# Patient Record
Sex: Female | Born: 1980 | Race: Black or African American | Hispanic: No | Marital: Single | Smoking: Current every day smoker
Health system: Southern US, Community
[De-identification: ages and names within clinical notes are randomized; demographics above are authoritative.]

## PROBLEM LIST (undated history)

## (undated) DIAGNOSIS — A630 Anogenital (venereal) warts: Secondary | ICD-10-CM

## (undated) HISTORY — DX: Anogenital (venereal) warts: A63.0

## (undated) HISTORY — PX: TUBAL LIGATION: SHX77

---

## 2005-08-06 ENCOUNTER — Emergency Department: Payer: Self-pay | Admitting: Emergency Medicine

## 2006-09-11 IMAGING — US US OB US >=[ID] SNGL FETUS
1 series · 17 of 28 positions shown · non-contrast
Comparison: none

REASON FOR EXAM: Pelvic pain 1st trimester. Rm 4 endovaginal as needed
COMMENTS:

[Series 1: us ob us >=(id) sngl fetus · 17 of 56 slices shown]
[im 1/56]
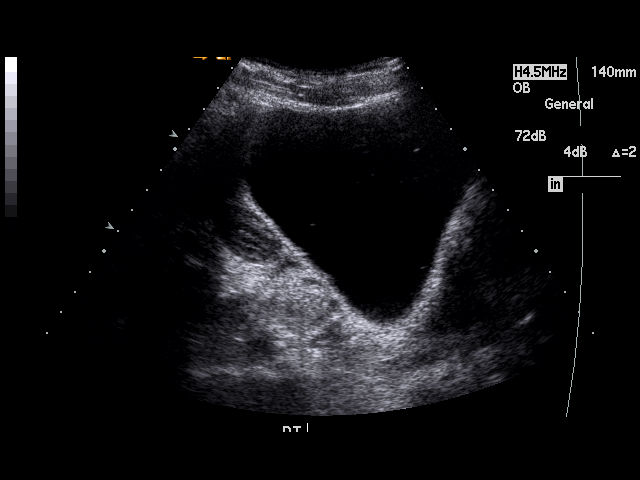
[im 5/56]
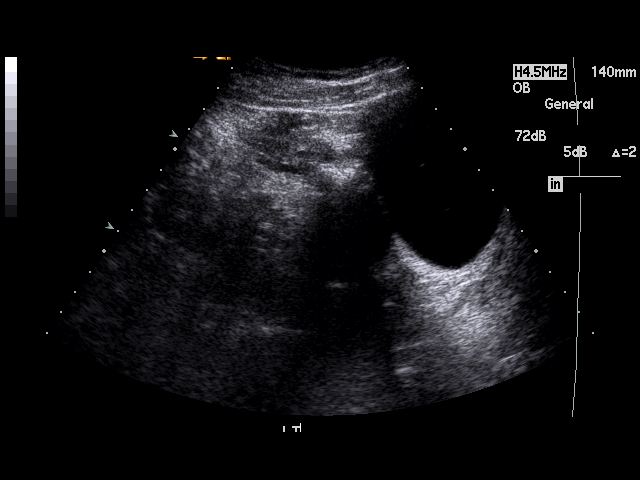
[im 9/56]
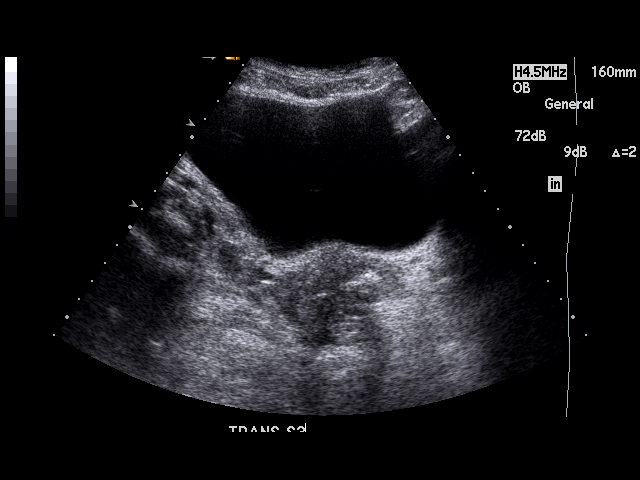
[im 11/56]
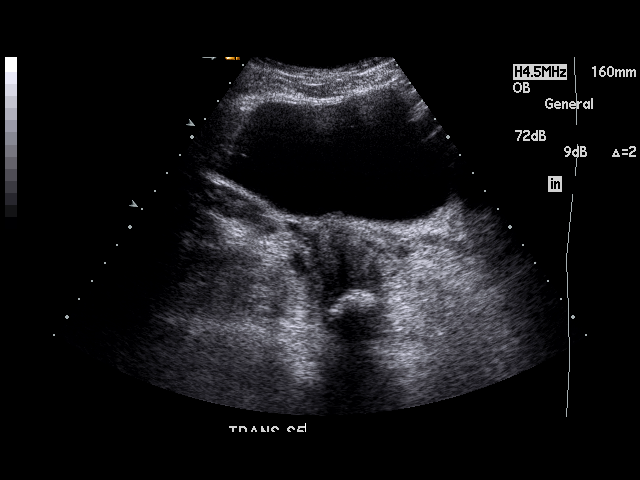
[im 15/56]
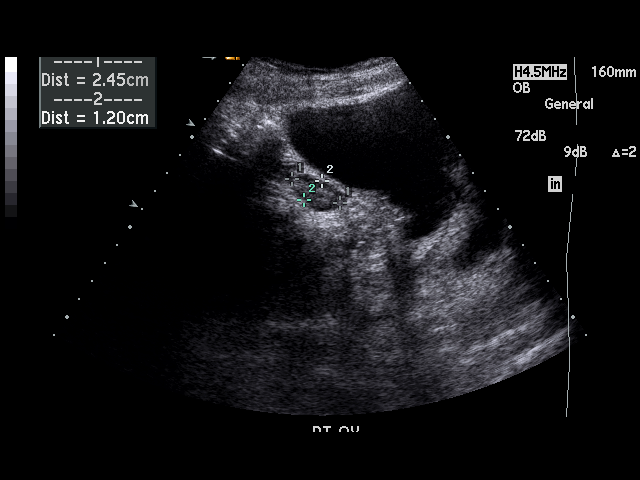
[im 19/56]
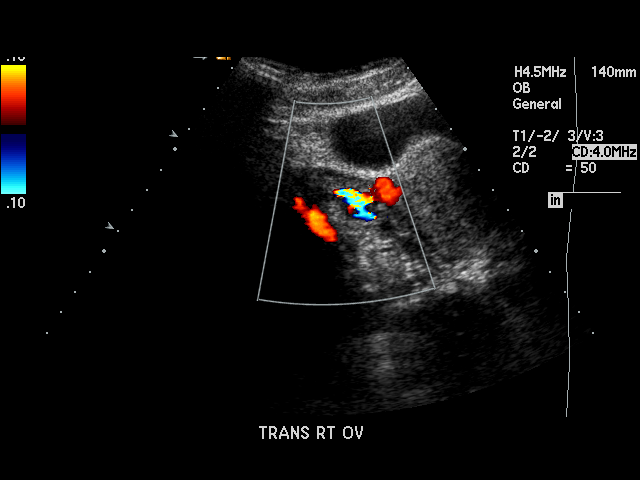
[im 21/56]
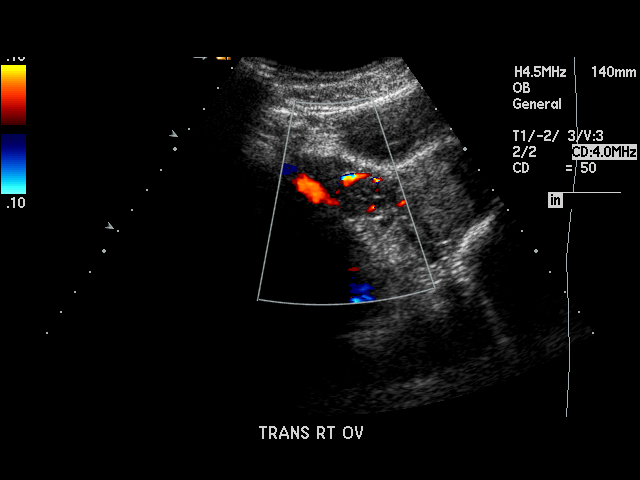
[im 25/56]
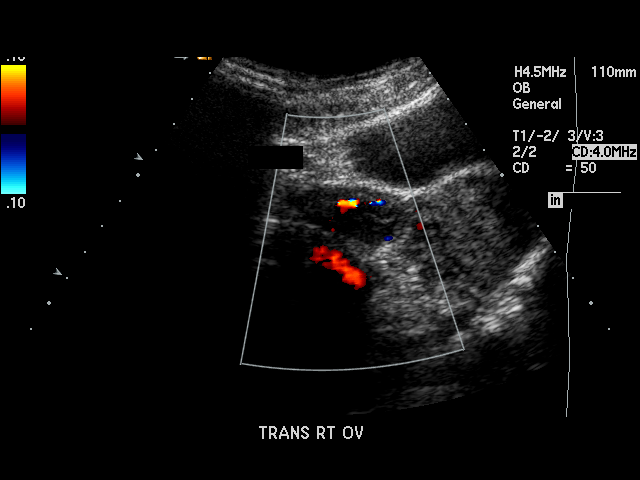
[im 29/56]
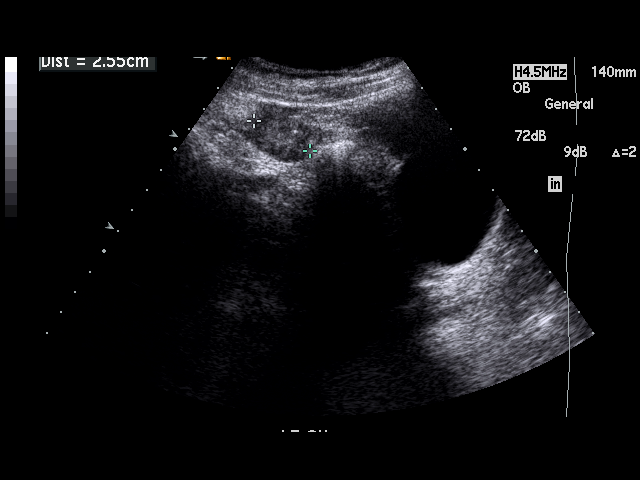
[im 31/56]
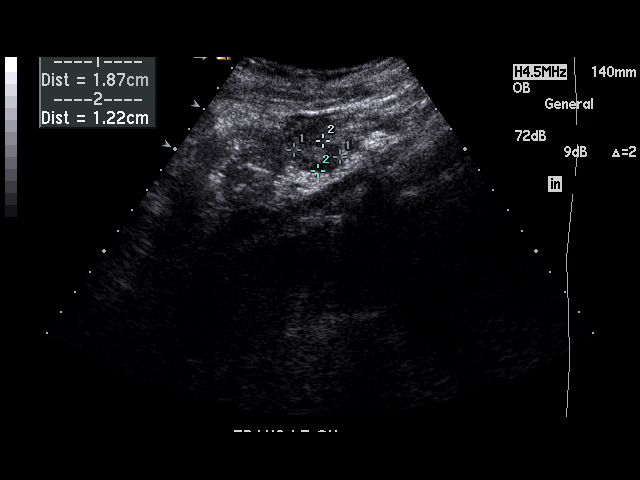
[im 35/56]
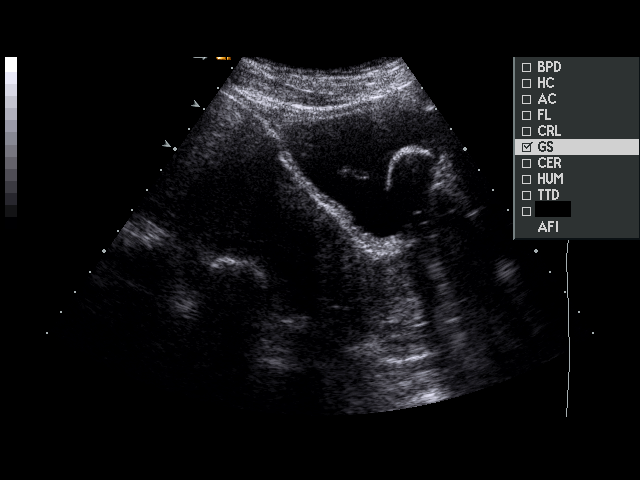
[im 37/56]
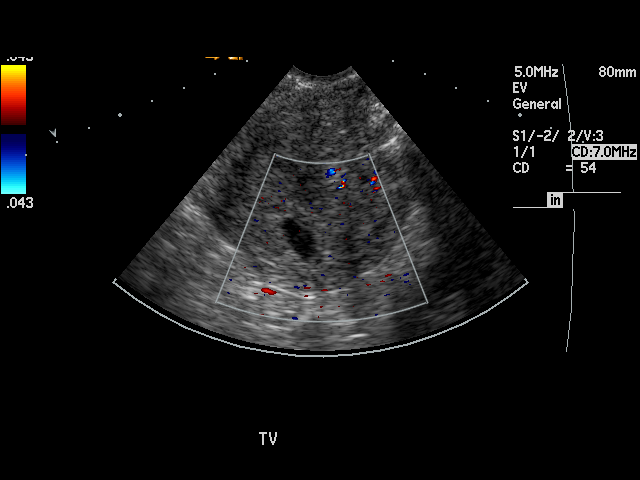
[im 41/56]
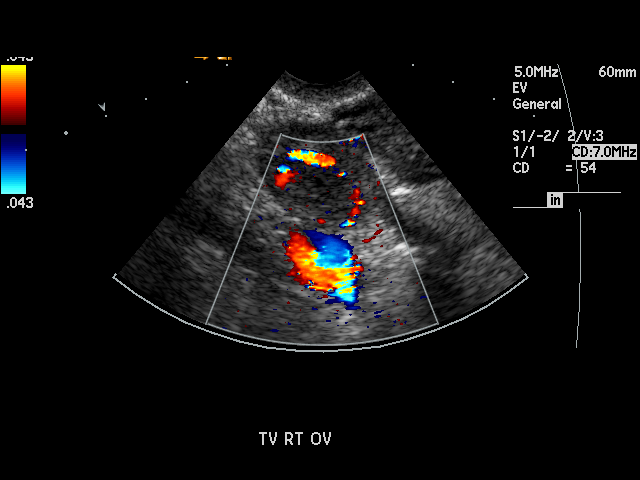
[im 45/56]
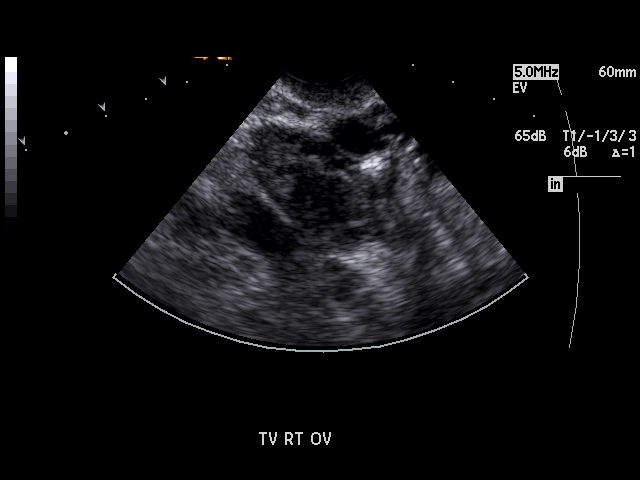
[im 47/56]
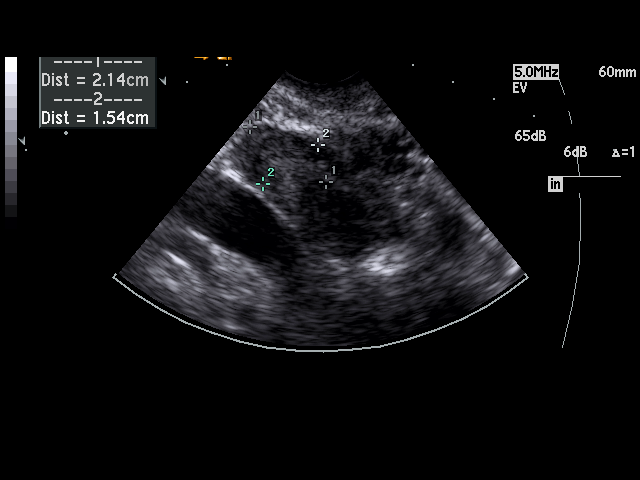
[im 51/56]
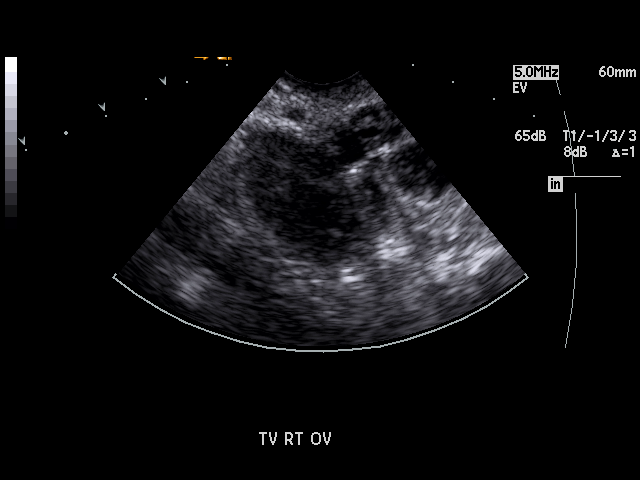
[im 56/56]
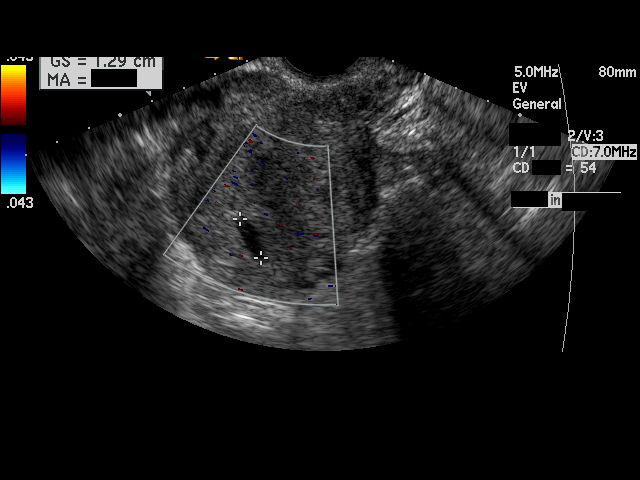

[17 of 28 positions shown; findings below may reference images not displayed]

PROCEDURE:     US  - US PREGNANCY / FETAL AGE  - August 06, 2005  [DATE]

RESULT:     Noted within the uterus is a small sac measuring 1.4 cm.  This
could be a normal early gestational sac or a pseudosac of ectopic pregnancy.
 No fetal pole or yolk sac is identified.  The possibility of an aborting
pregnancy or anembryonic pregnancy cannot be excluded.  Noted in the RIGHT
adnexa and ovary region is a small 1.7 cm hypoechoic region which could
represent a lesion within the RIGHT ovary including ectopic pregnancy and
close serial follow-up beta HCGs and ultrasound is suggested.  The RIGHT
ovary is otherwise unremarkable.  The LEFT ovary is unremarkable.
IMPRESSION: Intrauterine small sac.  Differential diagnosis includes early gestation,
anembryonic pregnancy, aborting pregnancy, and ectopic pregnancy
(pseudosac).

Small hypoechoic 1.7 cm lesion in the RIGHT ovary.  This could represent a
benign lesion such as hemorrhage within a cyst or more ominous process such
as ectopic pregnancy or other focal ovarian pathology. Serial beta HCGs and
pelvic ultrasound is suggested for further evaluation particularly to
exclude ectopic pregnancy.

## 2007-07-09 ENCOUNTER — Emergency Department: Payer: Self-pay | Admitting: Unknown Physician Specialty

## 2017-02-17 ENCOUNTER — Ambulatory Visit: Payer: Self-pay | Admitting: Obstetrics and Gynecology

## 2017-02-20 ENCOUNTER — Encounter: Payer: Self-pay | Admitting: Obstetrics & Gynecology

## 2017-02-20 ENCOUNTER — Ambulatory Visit (INDEPENDENT_AMBULATORY_CARE_PROVIDER_SITE_OTHER): Payer: BLUE CROSS/BLUE SHIELD | Admitting: Obstetrics & Gynecology

## 2017-02-20 VITALS — BP 100/60 | HR 67 | Ht 71.0 in | Wt 209.0 lb

## 2017-02-20 DIAGNOSIS — A630 Anogenital (venereal) warts: Secondary | ICD-10-CM

## 2017-02-20 LAB — NUSWAB VAGINITIS PLUS (VG+): Candida albicans, NAA: POSITIVE

## 2017-02-20 MED ORDER — IMIQUIMOD 5 % EX CREA: TOPICAL_CREAM | CUTANEOUS | 3 refills | Status: DC

## 2017-02-20 NOTE — Patient Instructions (Addendum)
Genital Warts Genital warts are a common STD (sexually transmitted disease). They may appear as small bumps on the tissues of the genital area or anal area. Sometimes, they can become irritated and cause pain. Genital warts are easily passed to other people through sexual contact. Getting treatment is important because genital warts can lead to other problems. In females, the virus that causes genital warts may increase the risk of cervical cancer. What are the causes? Genital warts are caused by a virus that is called human papillomavirus (HPV). HPV is spread by having unprotected sex with an infected person. It can be spread through vaginal, anal, and oral sex. Many people do not know that they are infected. They may be infected for years without problems. However, even if they do not have problems, they can pass the infection to their sexual partners. What increases the risk? Genital warts are more likely to develop in:  People who have unprotected sex.  People who have multiple sexual partners.  People who become sexually active before they are 34 years of age.  Men who are not circumcised.  Women who have a female sexual partner who is not circumcised.  People who have a weakened body defense system (immune system) due to disease or medicine.  People who smoke. What are the signs or symptoms? Symptoms of genital warts include:  Small growths in the genital area or anal area. These warts often grow in clusters.  Itching and irritation in the genital area or anal area.  Bleeding from the warts.  Painful sexual intercourse. How is this diagnosed? Genital warts can usually be diagnosed from their appearance on the vagina, vulva, penis, perineum, anus, or rectum. Tests may also be done, such as:  Biopsy. A tissue sample is removed so it can be looked at under a microscope.  Colposcopy. In females, a magnifying tool is used to examine the vagina and cervix. Certain solutions may be  used to make the HPV cells change color so they can be seen more easily.  A Pap test in females.  Tests for other STDs. How is this treated? Treatment for genital warts may include:  Applying prescription medicines to the warts. These may be solutions or creams.  Freezing the warts with liquid nitrogen (cryotherapy).  Burning the warts with:  Laser treatment.  An electrified probe (electrocautery).  Injecting a substance (Candida antigen or Trichophyton antigen) into the warts to help the body's immune system to fight off the warts.  Interferon injections.  Surgery to remove the warts. Follow these instructions at home: Medicines   Apply over-the-counter and prescription medicines only as told by your health care provider.  Do not treat genital warts with medicines that are used for treating hand warts.  Talk with your health care provider about using over-the-counter anti-itch creams. General instructions   Do not touch or scratch the warts.  Do not have sex until your treatment has been completed.  Tell your current and past sexual partners about your condition because they may also need treatment.  Keep all follow-up visits as told by your health care provider. This is important.  After treatment, use condoms during sex to prevent future infections. Other Instructions for Women   Women who have genital warts might need increased screening for cervical cancer. This type of cancer is slow growing and can be cured if it is found early. Chances of developing cervical cancer are increased with HPV.  If you become pregnant, tell your health care  provider that you have had HPV. Your health care provider will monitor you closely during pregnancy to be sure that your baby is safe. How is this prevented? Talk with your health care provider about getting the HPV vaccines. These vaccines prevent some HPV infections and cancers. It is recommended that the vaccine be given to  males and females who are 569-34 years of age. It will not work if you already have HPV, and it is not recommended for pregnant women. Contact a health care provider if:  You have redness, swelling, or pain in the area of the treated skin.  You have a fever.  You feel generally ill.  You feel lumps in and around your genital area or anal area.  You have bleeding in your genital area or anal area.  You have pain during sexual intercourse. This information is not intended to replace advice given to you by your health care provider. Make sure you discuss any questions you have with your health care provider. Document Released: 11/15/2000 Document Revised: 04/25/2016 Document Reviewed: 02/13/2015 Elsevier Interactive Patient Education  2017 Elsevier Inc.  Imiquimod skin cream What is this medicine? IMIQUIMOD (i mi KWI mod) cream is used to treat external genital or anal warts. It is also used to treat other skin conditions such as actinic keratosis and certain types of skin cancer. This medicine may be used for other purposes; ask your health care provider or pharmacist if you have questions. COMMON BRAND NAME(S): Celesta AverAldara, Zyclara What should I tell my health care provider before I take this medicine? They need to know if you have any of these conditions: -decreased immune function -an unusual or allergic reaction to imiquimod, other medicines, foods, dyes, or preservatives -pregnant or trying to get pregnant -breast-feeding How should I use this medicine? This medicine is for external use only. Do not take by mouth. Follow the directions on the prescription label. Apply just before bedtime. Wash your hands before and after use. Apply a thin layer of cream and massage gently into the affected areas until no longer visible. Do not use in the mouth, eyes or the vagina. Use this medicine only on the affected area as directed by your health care provider. Do not use for longer than prescribed. It  is important not to use more medicine than prescribed. To do so may increase the chance of side effects. Talk to your pediatrician regarding the use of this medicine in children. While this drug may be prescribed for children as young as 34 years of age for selected conditions, precautions do apply. Overdosage: If you think you have taken too much of this medicine contact a poison control center or emergency room at once. NOTE: This medicine is only for you. Do not share this medicine with others. What if I miss a dose? If you miss a dose, use it as soon as you can. If it is almost time for your next dose, use only that dose. Do not use double or extra doses. What may interact with this medicine? Interactions are not expected. Do not use any other medicines on the treated area without asking your doctor or health care professional. This list may not describe all possible interactions. Give your health care provider a list of all the medicines, herbs, non-prescription drugs, or dietary supplements you use. Also tell them if you smoke, drink alcohol, or use illegal drugs. Some items may interact with your medicine. What should I watch for while using this medicine? Visit  your health care professional for regular checks on your progress. Do not use this medicine until the skin has healed from any other drug (example: podofilox or podophyllin resin) or surgical skin treatment. Females should receive regular pelvic exams while being treated for genital warts. Most patients see improvement within 4 weeks. It may take up to 16 weeks to see a full clearing of the warts. This medicine is not a cure. New warts may develop during or after treatment. Avoid sexual (genital, anal, oral) contact while the cream is on the skin. If warts are visible in the genital area, sexual contact should be avoided until the warts are treated. The use of latex condoms during sexual contact may reduce, but not entirely prevent,  infecting others. This medicine may weaken condoms, diaphragms, cervical caps or other barrier devices and make them less effective as birth control. Do not cover the treated area with an airtight bandage. Cotton gauze dressings can be used. Cotton underwear can be worn after using this medicine on the genital or anal area. Actinic keratoses that were not seen before may appear during treatment and may later go away. The treatment area and surrounding area may lighten or darken after treatment with this medicine. These skin color changes may be permanent in some patients. If you experience a skin reaction at the treatment site that interferes or prevents you from doing any daily activity, contact your health care provider. You may need a rest period from treatment. Treatment may be restarted once the reaction has gotten better as recommended by your doctor or health care professional. This medicine can make you more sensitive to the sun. Keep out of the sun. If you cannot avoid being in the sun, wear protective clothing and use sunscreen. Do not use sun lamps or tanning beds/booths. What side effects may I notice from receiving this medicine? Side effects that you should report to your doctor or health care professional as soon as possible: -open sores with or without drainage -skin infection -skin rash -unusual or severe skin reaction Side effects that usually do not require medical attention (report to your doctor or health care professional if they continue or are bothersome): -burning or itching -redness of the skin (very common but is usually not painful or harmful) -scabbing, crusting, or peeling skin -skin that becomes hard or thickened -swelling of the skin This list may not describe all possible side effects. Call your doctor for medical advice about side effects. You may report side effects to FDA at 1-800-FDA-1088. Where should I keep my medicine? Keep out of the reach of  children. Store between 4 and 25 degrees C (39 and 77 degrees F). Do not freeze. Throw away any unused medicine after the expiration date. Discard packet after applying to affected area. Partial packets should not be saved or reused. NOTE: This sheet is a summary. It may not cover all possible information. If you have questions about this medicine, talk to your doctor, pharmacist, or health care provider.  2018 Elsevier/Gold Standard (2008-11-01 10:33:25)

## 2017-02-20 NOTE — Progress Notes (Signed)
  CC: Labial Lesion Patient presents for female check due to recent lesion. Sexual history reviewed with the patient. STD exposure: denies knowledge of risky exposure.  Previous history of STD:  Warts in distant pass, treated by "burning them off".. Current symptoms include recent bump on perineum. Symptoms include itch; no pain or leakage or discharge.  Contraception: tubal ligation.   PMHx: She  has a past medical history of Warts, genital. Also,  has a past surgical history that includes Cesarean section and Tubal ligation., family history includes Hypertension in her mother.,  reports that she has been smoking.  She has never used smokeless tobacco. She reports that she drinks alcohol. She reports that she does not use drugs.  She has a current medication list which includes the following prescription(s): imiquimod. Also, has no allergies on file.  Review of Systems  Constitutional: Negative for chills, fever and malaise/fatigue.  HENT: Negative for congestion, sinus pain and sore throat.   Eyes: Negative for blurred vision and pain.  Respiratory: Negative for cough and wheezing.   Cardiovascular: Negative for chest pain and leg swelling.  Gastrointestinal: Negative for abdominal pain, constipation, diarrhea, heartburn, nausea and vomiting.  Genitourinary: Negative for dysuria, frequency, hematuria and urgency.  Musculoskeletal: Negative for back pain, joint pain, myalgias and neck pain.  Skin: Negative for itching and rash.  Neurological: Negative for dizziness, tremors and weakness.  Endo/Heme/Allergies: Does not bruise/bleed easily.  Psychiatric/Behavioral: Negative for depression. The patient is not nervous/anxious and does not have insomnia.     Objective: BP 100/60   Pulse 67   Ht 5\' 11"  (1.803 m)   Wt 209 lb (94.8 kg)   LMP 02/17/2017   BMI 29.15 kg/m  Physical Exam  Constitutional: She is oriented to person, place, and time. She appears well-developed and well-nourished.  No distress.  Genitourinary: Vagina normal. Pelvic exam was performed with patient supine.  There is lesion on the right labia. There is no rash or tenderness on the right labia. There is no rash, tenderness or lesion on the left labia.    No erythema or bleeding in the vagina.  Genitourinary Comments: Right sided 0.75cm lesion flaky raised white  Abdominal: Soft. She exhibits no distension. There is no tenderness.  Musculoskeletal: Normal range of motion.  Neurological: She is alert and oriented to person, place, and time. No cranial nerve deficit.  Skin: Skin is warm and dry.  Psychiatric: She has a normal mood and affect.    ASSESSMENT/PLAN:    Visit Diagnoses    Condyloma acuminata    -  Primary   Relevant Medications   imiquimod (ALDARA) 5 % cream    Monitor Sexual partner/ contagious discussed Annual when due  Annamarie MajorPaul Shakeera Rightmyer, MD, Merlinda FrederickFACOG [facility], [facility] 02/20/2017  4:02 PM

## 2017-03-31 ENCOUNTER — Ambulatory Visit: Payer: BLUE CROSS/BLUE SHIELD | Admitting: Obstetrics & Gynecology

## 2018-01-23 DIAGNOSIS — Z0000 Encounter for general adult medical examination without abnormal findings: Secondary | ICD-10-CM | POA: Diagnosis not present

## 2018-01-23 DIAGNOSIS — D500 Iron deficiency anemia secondary to blood loss (chronic): Secondary | ICD-10-CM | POA: Diagnosis not present

## 2018-01-23 DIAGNOSIS — F17200 Nicotine dependence, unspecified, uncomplicated: Secondary | ICD-10-CM | POA: Diagnosis not present

## 2019-06-21 DIAGNOSIS — M222X1 Patellofemoral disorders, right knee: Secondary | ICD-10-CM | POA: Diagnosis not present

## 2019-06-21 DIAGNOSIS — M222X2 Patellofemoral disorders, left knee: Secondary | ICD-10-CM | POA: Diagnosis not present

## 2023-12-17 ENCOUNTER — Ambulatory Visit: Payer: Self-pay | Admitting: Family Medicine

## 2024-07-25 ENCOUNTER — Encounter: Payer: Self-pay | Admitting: Emergency Medicine

## 2024-07-25 ENCOUNTER — Ambulatory Visit
Admission: EM | Admit: 2024-07-25 | Discharge: 2024-07-25 | Disposition: A | Discharge status: Home / Self Care | Attending: Emergency Medicine | Admitting: Emergency Medicine

## 2024-07-25 DIAGNOSIS — H66003 Acute suppurative otitis media without spontaneous rupture of ear drum, bilateral: Secondary | ICD-10-CM

## 2024-07-25 MED ORDER — AMOXICILLIN-POT CLAVULANATE 875-125 MG PO TABS: 1.0000 | ORAL_TABLET | Freq: Two times a day (BID) | ORAL | 0 refills | Status: AC

## 2024-07-25 NOTE — ED Provider Notes (Signed)
 [facility]    CSN: [id number] Arrival date & time: 07/25/24  1238      History   Chief Complaint Chief Complaint  Patient presents with   Otalgia    right   Headache    HPI Diane Fuller is a 43 y.o. female.   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  This is causing her to have pain behind her right eye and a right sided headache.  She denies any fever, drainage from the ear, or changes in hearing.  No URI symptoms.  She has experienced some slight ringing in the ear.  Past Medical History:  Diagnosis Date   Warts, genital     There are no active problems to display for this patient.   Past Surgical History:  Procedure Laterality Date   CESAREAN SECTION     TUBAL LIGATION      OB History     Gravida  6   Para  4   Term  4   Preterm      AB  2   Living  4      SAB      IAB      Ectopic      Multiple      Live Births  4            Home Medications    Prior to Admission medications   Medication Sig Start Date End Date Taking? Authorizing Provider  amoxicillin -clavulanate (AUGMENTIN ) 875-125 MG tablet Take 1 tablet by mouth every 12 (twelve) hours for 7 days. 07/25/24 08/01/24 Yes Bernardino Ditch, NP    Family History Family History  Problem Relation Age of Onset   Hypertension Mother     Social History Social History   Tobacco Use   Smoking status: Former    Types: Cigarettes   Smokeless tobacco: Never  Vaping Use   Vaping status: Every Day  Substance Use Topics   Alcohol use: Yes   Drug use: No     Allergies   Patient has no known allergies.   Review of Systems Review of Systems  Constitutional:  Negative for fever.  HENT:  Positive for ear pain and tinnitus. Negative for congestion, ear discharge, hearing loss and rhinorrhea.   Neurological:  Positive for headaches.     Physical Exam Triage Vital  Signs ED Triage Vitals  Encounter Vitals Group     BP      Girls Systolic BP Percentile      Girls Diastolic BP Percentile      Boys Systolic BP Percentile      Boys Diastolic BP Percentile      Pulse      Resp      Temp      Temp src      SpO2      Weight      Height      Head Circumference      Peak Flow      Pain Score      Pain Loc      Pain Education      Exclude from Growth Chart    No data found.  Updated Vital Signs BP 107/73 (BP Location: Right Arm)   Pulse 65   Temp 98.3 F (36.8 C) (Oral)   Resp 14   Ht 5' 11 (1.803 m)   Wt 208 lb  15.9 oz (94.8 kg)   LMP 06/29/2024 (Approximate)   SpO2 95%   BMI 29.15 kg/m   Visual Acuity Right Eye Distance:   Left Eye Distance:   Bilateral Distance:    Right Eye Near:   Left Eye Near:    Bilateral Near:     Physical Exam Vitals and nursing note reviewed.  Constitutional:      Appearance: Normal appearance. She is not ill-appearing.  HENT:     Head: Normocephalic and atraumatic.     Right Ear: Ear canal and external ear normal. There is no impacted cerumen.     Left Ear: Ear canal and external ear normal. There is no impacted cerumen.     Ears:     Comments: Bilateral tympanic membranes are erythematous.  The right ear is tender with movement of the auricle of the ear. Skin:    General: Skin is warm and dry.     Capillary Refill: Capillary refill takes less than 2 seconds.     Findings: No rash.  Neurological:     General: No focal deficit present.     Mental Status: She is alert and oriented to person, place, and time.      UC Treatments / Results  Labs (all labs ordered are listed, but only abnormal results are displayed) Labs Reviewed - No data to display  EKG   Radiology No results found.  Procedures Procedures (including critical care time)  Medications Ordered in UC Medications - No data to display  Initial Impression / Assessment and Plan / UC Course  I have reviewed the triage  vital signs and the nursing notes.  Pertinent labs & imaging results that were available during my care of the patient were reviewed by me and considered in my medical decision making (see chart for details).   Patient is a pleasant, nontoxic-appearing 43 year old female presenting for evaluation of right ear pain as outlined HPI above.  Her physical exam reveals bilateral tympanic membrane erythema.  Both EACs are clear.  The patient does have pain with movement of the auricle of her right ear.  No evidence of otitis externa present.  Also no evidence of mastoiditis.  I will treat the patient for bilateral otitis media on Augmentin  875 twice daily for 7 days.  Tylenol and ibuprofen as needed for pain.  Return precautions reviewed.  Work note provided.   Final Clinical Impressions(s) / UC Diagnoses   Final diagnoses:  Non-recurrent acute suppurative otitis media of both ears without spontaneous rupture of tympanic membranes     Discharge Instructions      Take the Augmentin  twice daily for 7 days with food for treatment of your ear infection.  Take an over-the-counter probiotic 1 hour after each dose of antibiotic to prevent diarrhea.  Use over-the-counter Tylenol and ibuprofen as needed for pain or fever.  Place a hot water bottle, or heating pad, underneath your pillowcase at night to help dilate up your ear and aid in pain relief as well as resolution of the infection.  Return for reevaluation for any new or worsening symptoms.      ED Prescriptions     Medication Sig Dispense Auth. Provider   amoxicillin -clavulanate (AUGMENTIN ) 875-125 MG tablet Take 1 tablet by mouth every 12 (twelve) hours for 7 days. 14 tablet Bernardino Ditch, NP      PDMP not reviewed this encounter.   Bernardino Ditch, NP 07/25/24 1256

## 2024-07-25 NOTE — ED Triage Notes (Signed)
 Patient c/o right ear pain that started a week ago.  Patient reports that she also is having right sided headache and pain behind her right eye.  Patient denies N/V.  Patient denies fevers.  Patient has not tried any OTC medicine.

## 2024-07-25 NOTE — Discharge Instructions (Signed)
 Take the Augmentin twice daily for 7 days with food for treatment of your ear infection.  Take an over-the-counter probiotic 1 hour after each dose of antibiotic to prevent diarrhea.  Use over-the-counter Tylenol and ibuprofen as needed for pain or fever.  Place a hot water bottle, or heating pad, underneath your pillowcase at night to help dilate up your ear and aid in pain relief as well as resolution of the infection.  Return for reevaluation for any new or worsening symptoms.

## 2024-08-26 NOTE — Progress Notes (Deleted)
    New patient visit   Patient: Diane Fuller   DOB: 09-24-1981   43 y.o. Female  MRN: [id number] Visit Date: 08/27/2024  Today's healthcare provider: Isaiah DELENA Pepper, MD   No chief complaint on file.  Subjective    Diane Fuller is a 43 y.o. female who presents today as a new patient to establish care.   HPI:  ***    Past Medical History:  Diagnosis Date   Warts, genital    Past Surgical History:  Procedure Laterality Date   CESAREAN SECTION     TUBAL LIGATION     Family Status  Relation Name Status   Mother  (Not Specified)  No partnership data on file   Family History  Problem Relation Age of Onset   Hypertension Mother    Social History   Socioeconomic History   Marital status: Single    Spouse name: Not on file   Number of children: Not on file   Years of education: Not on file   Highest education level: Not on file  Occupational History   Not on file  Tobacco Use   Smoking status: Former    Types: Cigarettes   Smokeless tobacco: Never  Vaping Use   Vaping status: Every Day  Substance and Sexual Activity   Alcohol use: Yes   Drug use: No   Sexual activity: Yes    Birth control/protection: Surgical  Other Topics Concern   Not on file  Social History Narrative   Not on file   Social Drivers of Health   Financial Resource Strain: Not on file  Food Insecurity: Not on file  Transportation Needs: Not on file  Physical Activity: Not on file  Stress: Not on file  Social Connections: Not on file   No outpatient medications prior to visit.   No facility-administered medications prior to visit.   No Known Allergies  Reviews of Systems as noted in HPI.  {Insert previous labs (optional):23779} {See past labs  Heme  Chem  Endocrine  Serology  Results Review (optional):1}   Objective    There were no vitals taken for this visit. {Insert last BP/Wt (optional):23777}{See vitals history (optional):1}   Physical  Exam Constitutional:      Appearance: Normal appearance.  HENT:     Head: Normocephalic and atraumatic.     Mouth/Throat:     Mouth: Mucous membranes are moist.  Eyes:     Pupils: Pupils are equal, round, and reactive to light.  Pulmonary:     Effort: Pulmonary effort is normal.  Skin:    General: Skin is warm.  Neurological:     General: No focal deficit present.     Mental Status: She is alert.     Depression Screen     No data to display         No results found for any visits on 08/27/24.  Assessment & Plan      Problem List Items Addressed This Visit   None    No follow-ups on file.      Isaiah DELENA Pepper, MD  [facility] [phone] (phone) [phone] (fax)

## 2024-08-27 ENCOUNTER — Ambulatory Visit

## 2024-09-16 ENCOUNTER — Encounter: Admitting: Certified Nurse Midwife

## 2024-09-24 ENCOUNTER — Ambulatory Visit (INDEPENDENT_AMBULATORY_CARE_PROVIDER_SITE_OTHER): Admitting: Physician Assistant

## 2024-09-24 ENCOUNTER — Encounter: Payer: Self-pay | Admitting: Physician Assistant

## 2024-09-24 VITALS — BP 102/73 | HR 71 | Resp 14 | Ht 69.0 in | Wt 221.6 lb

## 2024-09-24 DIAGNOSIS — Z720 Tobacco use: Secondary | ICD-10-CM

## 2024-09-24 DIAGNOSIS — E669 Obesity, unspecified: Secondary | ICD-10-CM

## 2024-09-24 DIAGNOSIS — Z1159 Encounter for screening for other viral diseases: Secondary | ICD-10-CM

## 2024-09-24 DIAGNOSIS — L819 Disorder of pigmentation, unspecified: Secondary | ICD-10-CM

## 2024-09-24 DIAGNOSIS — Z7689 Persons encountering health services in other specified circumstances: Secondary | ICD-10-CM

## 2024-09-24 DIAGNOSIS — F1090 Alcohol use, unspecified, uncomplicated: Secondary | ICD-10-CM

## 2024-09-24 NOTE — Progress Notes (Signed)
 New patient visit  Patient: Diane Fuller   DOB: 01-08-1981   43 y.o. Female  MRN: [id number] Visit Date: 09/24/2024  Today's healthcare provider: Jolynn Spencer, PA-C   Chief Complaint  Patient presents with   New Patient (Initial Visit)    Last PCP: has not had one  Last mammogram: none done Last PAP: maybe 3-4 years ago   Subjective    Diane Fuller is a 43 y.o. female who presents today as a new patient to establish care.   HPI     New Patient (Initial Visit)    Additional comments: Last PCP: has not had one  Last mammogram: none done Last PAP: maybe 3-4 years ago      Last edited by Wilfred Hargis RAMAN, CMA on 09/24/2024  3:08 PM.      Discussed the use of AI scribe software for clinical note transcription with the patient, who gave verbal consent to proceed.  History of Present Illness Diane Fuller is a 43 year old female who presents to establish primary care.  She seeks guidance on scheduling a mammogram and is uncertain whether to arrange it through her primary care provider or OBGYN. Her Pap smears have been normal. She has no history of chest pain, shortness of breath, palpitations, or chronic conditions. It has been a significant amount of time since her last blood work.  She consumes alcohol occasionally, typically drinking twice a week, which includes three shots, two beers, or a couple of glasses of wine. She smoked for fifteen years, with a pack lasting two days, but has switched to nicotine vaping for the past year. She does not use cannabis or recreational drugs.  She experiences anxiety but has never been formally diagnosed. She is currently seeing a therapist at [facility], which is not covered by her insurance. She is concerned about the copay if she were to switch to a therapist covered by her insurance.  She works as a engineer, civil (consulting) at [employer], a nursing home, and describes the work as 'very, very tough.'    Past Medical History:   Diagnosis Date   Warts, genital    Past Surgical History:  Procedure Laterality Date   CESAREAN SECTION     TUBAL LIGATION     Family Status  Relation Name Status   Mother  (Not Specified)  No partnership data on file   Family History  Problem Relation Age of Onset   Hypertension Mother    Social History   Socioeconomic History   Marital status: Single    Spouse name: Not on file   Number of children: Not on file   Years of education: Not on file   Highest education level: Not on file  Occupational History   Not on file  Tobacco Use   Smoking status: Former    Types: Cigarettes   Smokeless tobacco: Never  Vaping Use   Vaping status: Every Day   Substances: Nicotine  Substance and Sexual Activity   Alcohol use: Yes   Drug use: No   Sexual activity: Yes    Birth control/protection: Surgical  Other Topics Concern   Not on file  Social History Narrative   Not on file   Social Drivers of Health   Financial Resource Strain: Low Risk  (09/24/2024)   Overall Financial Resource Strain (CARDIA)    Difficulty of Paying Living Expenses: Not hard at all  Food Insecurity: No Food Insecurity (09/24/2024)   Hunger Vital Sign    Worried  About Running Out of Food in the Last Year: Never true    Ran Out of Food in the Last Year: Never true  Transportation Needs: No Transportation Needs (09/24/2024)   PRAPARE - Administrator, Civil Service (Medical): No    Lack of Transportation (Non-Medical): No  Physical Activity: Sufficiently Active (09/24/2024)   Exercise Vital Sign    Days of Exercise per Week: 5 days    Minutes of Exercise per Session: 60 min  Stress: No Stress Concern Present (09/24/2024)   [employer] of Occupational Health - Occupational Stress Questionnaire    Feeling of Stress: Not at all  Social Connections: Not on file   No outpatient medications prior to visit.   No facility-administered medications prior to visit.   No Known  Allergies  Immunization History  Administered Date(s) Administered   Moderna Sars-Covid-2 Vaccination 01/10/2020, 02/07/2020   PPD Test 05/09/2020   Td (Adult),5 Lf Tetanus Toxid, Preservative Free 05/09/2020   Tdap 10/26/2008    Health Maintenance  Topic Date Due   HIV Screening  Never done   Hepatitis C Screening  Never done   Hepatitis B Vaccines 19-59 Average Risk (1 of 3 - 19+ 3-dose series) Never done   HPV VACCINES (1 - 3-dose SCDM series) Never done   Cervical Cancer Screening (HPV/Pap Cotest)  10/14/2013   Mammogram  Never done   COVID-19 Vaccine (3 - 2025-26 season) 08/02/2024   Influenza Vaccine  03/01/2025 (Originally 07/02/2024)   DTaP/Tdap/Td (3 - Td or Tdap) 05/09/2030   Pneumococcal Vaccine  Aged Out   Meningococcal B Vaccine  Aged Out    Patient Care Team: Pearl Berlinger, PA-C as PCP - General (Physician Assistant)  Review of Systems Except see HPI       Objective    BP 102/73   Pulse 71   Resp 14   Ht 5' 9 (1.753 m)   Wt 221 lb 9.6 oz (100.5 kg)   LMP 09/19/2024   SpO2 99%   BMI 32.72 kg/m     Physical Exam Vitals reviewed.  Constitutional:      General: She is not in acute distress.    Appearance: Normal appearance. She is well-developed. She is obese. She is not diaphoretic.  HENT:     Head: Normocephalic and atraumatic.  Eyes:     General: No scleral icterus.    Conjunctiva/sclera: Conjunctivae normal.  Neck:     Thyroid: No thyromegaly.  Cardiovascular:     Rate and Rhythm: Normal rate and regular rhythm.     Pulses: Normal pulses.     Heart sounds: Normal heart sounds. No murmur heard. Pulmonary:     Effort: Pulmonary effort is normal. No respiratory distress.     Breath sounds: Normal breath sounds. No wheezing, rhonchi or rales.  Musculoskeletal:     Cervical back: Neck supple.     Right lower leg: No edema.     Left lower leg: No edema.  Lymphadenopathy:     Cervical: No cervical adenopathy.  Skin:    General: Skin is  warm and dry.     Findings: No rash.  Neurological:     Mental Status: She is alert and oriented to person, place, and time. Mental status is at baseline.  Psychiatric:        Mood and Affect: Mood normal.        Behavior: Behavior normal.     Depression Screen    09/24/2024  3:12 PM  PHQ 2/9 Scores  PHQ - 2 Score 0   No results found for any visits on 09/24/24.  Assessment & Plan      Assessment & Plan Adult office Visit Routine visit to establish primary care. No chronic conditions. Due for mammogram. - Order comprehensive blood work including Hepatitis C antibody screening. - Discuss mammogram scheduling with OBGYN.  Anxiety Chronic Reports anxiety, seeing therapist at [facility], not covered by insurance. - Discuss potential transition to in-clinic counseling services that may be covered by insurance. - Suggest contacting insurance to check coverage for in-clinic counseling services.  Tobacco Use Chronic Smoking for fifteen years, using nicotine vaping for one year. No cannabis use. Cessation advised Will revisit  Alcohol Use Alcohol consumption twice a week, three shots, two beers, or a couple of glasses of wine per occasion. - Advise limiting intake to less than four shots per occasion to avoid increased risk, especially in women.  Obesity (BMI 30-39.9) (Primary) Chronic and stable Body mass index is 32.72 kg/m. Weight loss of 5% of pt's current weight via healthy diet and daily exercise encouraged. Workup ordered - Hemoglobin A1c - Comprehensive metabolic panel with GFR - Lipid Panel With LDL/HDL Ratio - TSH Will follow-up  Need for hepatitis C screening test Low risk screening - Hepatitis C antibody  Pigmented skin lesion  - Ambulatory referral to Dermatology  Encounter to establish care Welcomed to our clinic Reviewed past medical hx, social hx, family hx and surgical hx Pt advised to send all vaccination records or screening   No  follow-ups on file.    The patient was advised to call back or seek an in-person evaluation if the symptoms worsen or if the condition fails to improve as anticipated.  I discussed the assessment and treatment plan with the patient. The patient was provided an opportunity to ask questions and all were answered. The patient agreed with the plan and demonstrated an understanding of the instructions.  I, Kamrynn Melott, PA-C have reviewed all documentation for this visit. The documentation on  09/24/2024   for the exam, diagnosis, procedures, and orders are all accurate and complete.  Jolynn Spencer, [facility], MMS [facility] [phone] (phone) [phone] (fax)  [facility]

## 2024-09-25 LAB — LIPID PANEL WITH LDL/HDL RATIO
Cholesterol, Total: 254 mg/dL — ABNORMAL HIGH (ref 100–199)
HDL: 85 mg/dL (ref >39)
LDL Chol Calc (NIH): 159 mg/dL — ABNORMAL HIGH (ref 0–99)
LDL/HDL Ratio: 1.9 ratio (ref 0.0–3.2)
Triglycerides: 64 mg/dL (ref 0–149)
VLDL Cholesterol Cal: 10 mg/dL (ref 5–40)

## 2024-09-25 LAB — COMPREHENSIVE METABOLIC PANEL WITH GFR
ALT: 14 IU/L (ref 0–32)
AST: 19 IU/L (ref 0–40)
Albumin: 4.3 g/dL (ref 3.9–4.9)
Alkaline Phosphatase: 55 IU/L (ref 41–116)
BUN/Creatinine Ratio: 19 (ref 9–23)
BUN: 14 mg/dL (ref 6–24)
Bilirubin Total: 0.5 mg/dL (ref 0.0–1.2)
CO2: 22 mmol/L (ref 20–29)
Calcium: 9.1 mg/dL (ref 8.7–10.2)
Chloride: 101 mmol/L (ref 96–106)
Creatinine, Ser: 0.73 mg/dL (ref 0.57–1.00)
Globulin, Total: 3.2 g/dL (ref 1.5–4.5)
Glucose: 87 mg/dL (ref 70–99)
Potassium: 4.1 mmol/L (ref 3.5–5.2)
Sodium: 135 mmol/L (ref 134–144)
Total Protein: 7.5 g/dL (ref 6.0–8.5)
eGFR: 107 mL/min/1.73 (ref >59)

## 2024-09-25 LAB — HEMOGLOBIN A1C
Est. average glucose Bld gHb Est-mCnc: 114 mg/dL
Hgb A1c MFr Bld: 5.6 % (ref 4.8–5.6)

## 2024-09-25 LAB — HEPATITIS C ANTIBODY: Hep C Virus Ab: NONREACTIVE

## 2024-09-25 LAB — TSH: TSH: 1.92 u[IU]/mL (ref 0.450–4.500)

## 2024-09-27 ENCOUNTER — Ambulatory Visit: Payer: Self-pay | Admitting: Physician Assistant

## 2024-09-27 DIAGNOSIS — E669 Obesity, unspecified: Secondary | ICD-10-CM | POA: Insufficient documentation

## 2024-09-27 DIAGNOSIS — Z720 Tobacco use: Secondary | ICD-10-CM | POA: Insufficient documentation

## 2024-09-27 DIAGNOSIS — F1090 Alcohol use, unspecified, uncomplicated: Secondary | ICD-10-CM | POA: Insufficient documentation

## 2024-09-27 DIAGNOSIS — L819 Disorder of pigmentation, unspecified: Secondary | ICD-10-CM | POA: Insufficient documentation

## 2024-10-20 ENCOUNTER — Ambulatory Visit

## 2024-10-20 DIAGNOSIS — D229 Melanocytic nevi, unspecified: Secondary | ICD-10-CM

## 2024-10-20 DIAGNOSIS — D225 Melanocytic nevi of trunk: Secondary | ICD-10-CM

## 2024-10-20 NOTE — Patient Instructions (Addendum)

## 2024-10-20 NOTE — Progress Notes (Signed)
    Subjective   Diane Fuller is a 43 y.o. female who presents for the following: Lesion(s) of concern . Patient is new patient  Today patient reports: Area of concern on the right flank and L mons   Review of Systems:    No other skin or systemic complaints except as noted in HPI or Assessment and Plan.  The following portions of the chart were reviewed this encounter and updated as appropriate: medications, allergies, medical history  Relevant Medical History:  n/a   Objective  (SKPE) Well appearing patient in no apparent distress; mood and affect are within normal limits. Examination was performed of the: Focused Exam of: right flank  and Genital exam female: groin, mons pubis, labia majora, labia minora, vulva, buttocks. Chaperone present in room.   Examination notable for: Nevus/nevi: Scattered well-demarcated, regular, pigmented macule(s) and/or papule(s)   on R flank, L mons pubis  Examination limited by: Clothing and Patient deferred removal       Assessment & Plan  (SKAP)   Melanocytic nevi x 2 - R flank, L mons pubis  - Benign appearing on today's exam, patient reassured - Continue active observation - Pt instructed on A,B,C, D, and Es of an evolving melanoma - Pt instructed to contact clinic if any of these worrisome changes arise - Reinforced importance of photoprotective strategies including liberal and frequent sunscreen use of a broad-spectrum SPF 30 or greater, use of protective clothing, and sun avoidance for prevention of cutaneous malignancy and photoaging.  Counseled patient on the importance of regular self-skin monitoring as well as routine clinical skin examinations as scheduled.     Level of service outlined above   Patient instructions (SKPI)   Procedures, orders, diagnosis for this visit:    There are no diagnoses linked to this encounter.  Return to clinic: Return if symptoms worsen or fail to improve.  I, Emerick Ege, CMA am acting as  scribe for Lauraine JAYSON Kanaris, MD.   Documentation: I have reviewed the above documentation for accuracy and completeness, and I agree with the above.  Lauraine JAYSON Kanaris, MD

## 2024-10-29 NOTE — Progress Notes (Deleted)
 Complete physical exam  Patient: Diane Fuller   DOB: 12/11/1980   43 y.o. Female  MRN: [id number] Visit Date: 11/05/2024  Today's healthcare provider: Jolynn Spencer, PA-C   No chief complaint on file.  Subjective    Diane Fuller is a 43 y.o. female who presents today for a complete physical exam.  She reports consuming a {diet types:17450} diet. {Exercise:19826} She generally feels {well/fairly well/poorly:18703}. She reports sleeping {well/fairly well/poorly:18703}. She {does/does not:200015} have additional problems to discuss today.  HPI  *** Discussed the use of AI scribe software for clinical note transcription with the patient, who gave verbal consent to proceed.  History of Present Illness   Except elevated cholesterol The 10-year ASCVD risk score (Arnett DK, et al., 2019) is: 0.1% Advised low-cholesterol diet and regular exercise  Last depression screening scores    09/24/2024    3:12 PM  PHQ 2/9 Scores  PHQ - 2 Score 0   Last fall risk screening    09/24/2024    3:12 PM  Fall Risk   Falls in the past year? 0  Number falls in past yr: 0  Injury with Fall? 0  Risk for fall due to : No Fall Risks  Follow up Falls evaluation completed   Last Audit-C alcohol use screening    09/24/2024    3:10 PM  Alcohol Use Disorder Test (AUDIT)  1. How often do you have a drink containing alcohol? 1  2. How many drinks containing alcohol do you have on a typical day when you are drinking? 0  3. How often do you have six or more drinks on one occasion? 0  AUDIT-C Score 1   A score of 3 or more in women, and 4 or more in men indicates increased risk for alcohol abuse, EXCEPT if all of the points are from question 1   Past Medical History:  Diagnosis Date   Warts, genital    Past Surgical History:  Procedure Laterality Date   CESAREAN SECTION     TUBAL LIGATION     Social History   Socioeconomic History   Marital status: Single    Spouse name: Not on  file   Number of children: Not on file   Years of education: Not on file   Highest education level: Not on file  Occupational History   Not on file  Tobacco Use   Smoking status: Former    Types: Cigarettes   Smokeless tobacco: Never  Vaping Use   Vaping status: Every Day   Substances: Nicotine  Substance and Sexual Activity   Alcohol use: Yes   Drug use: No   Sexual activity: Yes    Birth control/protection: Surgical  Other Topics Concern   Not on file  Social History Narrative   Not on file   Social Drivers of Health   Financial Resource Strain: Low Risk  (09/24/2024)   Overall Financial Resource Strain (CARDIA)    Difficulty of Paying Living Expenses: Not hard at all  Food Insecurity: No Food Insecurity (09/24/2024)   Hunger Vital Sign    Worried About Running Out of Food in the Last Year: Never true    Ran Out of Food in the Last Year: Never true  Transportation Needs: No Transportation Needs (09/24/2024)   PRAPARE - Administrator, Civil Service (Medical): No    Lack of Transportation (Non-Medical): No  Physical Activity: Sufficiently Active (09/24/2024)   Exercise Vital Sign  Days of Exercise per Week: 5 days    Minutes of Exercise per Session: 60 min  Stress: No Stress Concern Present (09/24/2024)   [employer] of Occupational Health - Occupational Stress Questionnaire    Feeling of Stress: Not at all  Social Connections: Not on file  Intimate Partner Violence: Not At Risk (09/24/2024)   Humiliation, Afraid, Rape, and Kick questionnaire    Fear of Current or Ex-Partner: No    Emotionally Abused: No    Physically Abused: No    Sexually Abused: No   Family Status  Relation Name Status   Mother  (Not Specified)  No partnership data on file   Family History  Problem Relation Age of Onset   Hypertension Mother    No Known Allergies  Patient Care Team: Merrik Puebla, PA-C as PCP - General (Physician Assistant)   Medications: No  outpatient medications prior to visit.   No facility-administered medications prior to visit.    Review of Systems  All other systems reviewed and are negative.  Except see HPI  {Insert previous labs (optional):23779} {See past labs  Heme  Chem  Endocrine  Serology  Results Review (optional):1}  Objective    There were no vitals taken for this visit. {Insert last BP/Wt (optional):23777}{See vitals history (optional):1}    Physical Exam Vitals reviewed.  Constitutional:      General: She is not in acute distress.    Appearance: Normal appearance. She is well-developed. She is not ill-appearing, toxic-appearing or diaphoretic.  HENT:     Head: Normocephalic and atraumatic.     Right Ear: Tympanic membrane, ear canal and external ear normal.     Left Ear: Tympanic membrane, ear canal and external ear normal.     Nose: Nose normal. No congestion or rhinorrhea.     Mouth/Throat:     Mouth: Mucous membranes are moist.     Pharynx: Oropharynx is clear. No oropharyngeal exudate.  Eyes:     General: No scleral icterus.       Right eye: No discharge.        Left eye: No discharge.     Conjunctiva/sclera: Conjunctivae normal.     Pupils: Pupils are equal, round, and reactive to light.  Neck:     Thyroid: No thyromegaly.     Vascular: No carotid bruit.  Cardiovascular:     Rate and Rhythm: Normal rate and regular rhythm.     Pulses: Normal pulses.     Heart sounds: Normal heart sounds. No murmur heard.    No friction rub. No gallop.  Pulmonary:     Effort: Pulmonary effort is normal. No respiratory distress.     Breath sounds: Normal breath sounds. No wheezing or rales.  Abdominal:     General: Abdomen is flat. Bowel sounds are normal. There is no distension.     Palpations: Abdomen is soft. There is no mass.     Tenderness: There is no abdominal tenderness. There is no right CVA tenderness, left CVA tenderness, guarding or rebound.     Hernia: No hernia is present.   Musculoskeletal:        General: No swelling, tenderness, deformity or signs of injury. Normal range of motion.     Cervical back: Normal range of motion and neck supple. No rigidity or tenderness.     Right lower leg: No edema.     Left lower leg: No edema.  Lymphadenopathy:     Cervical: No cervical adenopathy.  Skin:  General: Skin is warm and dry.     Coloration: Skin is not jaundiced or pale.     Findings: No bruising, erythema, lesion or rash.  Neurological:     Mental Status: She is alert and oriented to person, place, and time. Mental status is at baseline.     Gait: Gait normal.  Psychiatric:        Mood and Affect: Mood normal.        Behavior: Behavior normal.        Thought Content: Thought content normal.        Judgment: Judgment normal.      No results found for any visits on 11/05/24.  Assessment & Plan    Routine Health Maintenance and Physical Exam  Exercise Activities and Dietary recommendations  Goals   None     Immunization History  Administered Date(s) Administered   Moderna Sars-Covid-2 Vaccination 01/10/2020, 02/07/2020   PPD Test 05/09/2020   Td (Adult),5 Lf Tetanus Toxid, Preservative Free 05/09/2020   Tdap 10/26/2008    Health Maintenance  Topic Date Due   HIV Screening  Never done   Hepatitis B Vaccine (1 of 3 - 19+ 3-dose series) Never done   HPV Vaccine (1 - Risk 3-dose SCDM series) Never done   Pap with HPV screening  10/14/2013   COVID-19 Vaccine (3 - Moderna risk series) 03/06/2020   Breast Cancer Screening  Never done   Flu Shot  03/01/2025*   DTaP/Tdap/Td vaccine (3 - Td or Tdap) 05/09/2030   Hepatitis C Screening  Completed   Pneumococcal Vaccine  Aged Out   Meningitis B Vaccine  Aged Out  *Topic was postponed. The date shown is not the original due date.    Discussed health benefits of physical activity, and encouraged her to engage in regular exercise appropriate for her age and condition.  Assessment and  Plan Assessment & Plan      ***  No follow-ups on file.    The patient was advised to call back or seek an in-person evaluation if the symptoms worsen or if the condition fails to improve as anticipated.  I discussed the assessment and treatment plan with the patient. The patient was provided an opportunity to ask questions and all were answered. The patient agreed with the plan and demonstrated an understanding of the instructions.  I, Nikiyah Fackler, PA-C have reviewed all documentation for this visit. The documentation on 11/05/2024  for the exam, diagnosis, procedures, and orders are all accurate and complete.  Jolynn Spencer, [facility], MMS [facility] [phone] (phone) [phone] (fax)  [facility]

## 2024-11-05 ENCOUNTER — Encounter: Admitting: Physician Assistant

## 2024-11-05 DIAGNOSIS — Z720 Tobacco use: Secondary | ICD-10-CM

## 2024-11-05 DIAGNOSIS — F1090 Alcohol use, unspecified, uncomplicated: Secondary | ICD-10-CM

## 2024-11-05 DIAGNOSIS — Z0000 Encounter for general adult medical examination without abnormal findings: Secondary | ICD-10-CM

## 2024-11-05 DIAGNOSIS — F419 Anxiety disorder, unspecified: Secondary | ICD-10-CM

## 2024-11-05 DIAGNOSIS — E669 Obesity, unspecified: Secondary | ICD-10-CM

## 2024-11-09 ENCOUNTER — Other Ambulatory Visit: Payer: Self-pay

## 2024-11-09 DIAGNOSIS — Z1231 Encounter for screening mammogram for malignant neoplasm of breast: Secondary | ICD-10-CM
